# Patient Record
Sex: Female | Born: 1985 | Race: White | Hispanic: No | Marital: Married | Smoking: Never smoker
Health system: Southern US, Community
[De-identification: ages and names within clinical notes are randomized; demographics above are authoritative.]

## PROBLEM LIST (undated history)

## (undated) ENCOUNTER — Inpatient Hospital Stay (HOSPITAL_COMMUNITY): Payer: Self-pay

## (undated) DIAGNOSIS — I10 Essential (primary) hypertension: Secondary | ICD-10-CM

## (undated) DIAGNOSIS — R51 Headache: Secondary | ICD-10-CM

## (undated) HISTORY — PX: WISDOM TOOTH EXTRACTION: SHX21

## (undated) HISTORY — PX: MENISCUS REPAIR: SHX5179

---
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
## 2011-07-02 ENCOUNTER — Ambulatory Visit
Admission: RE | Admit: 2011-07-02 | Discharge: 2011-07-02 | Disposition: A | Discharge status: Home / Self Care | Payer: PRIVATE HEALTH INSURANCE | Source: Ambulatory Visit | Attending: Occupational Medicine | Admitting: Occupational Medicine

## 2011-07-02 ENCOUNTER — Other Ambulatory Visit: Payer: Self-pay | Admitting: Occupational Medicine

## 2011-07-02 DIAGNOSIS — Z021 Encounter for pre-employment examination: Secondary | ICD-10-CM

## 2013-05-23 LAB — OB RESULTS CONSOLE ANTIBODY SCREEN: ANTIBODY SCREEN: NEGATIVE

## 2013-05-23 LAB — OB RESULTS CONSOLE GC/CHLAMYDIA
CHLAMYDIA, DNA PROBE: NEGATIVE
Gonorrhea: NEGATIVE

## 2013-05-23 LAB — OB RESULTS CONSOLE HGB/HCT, BLOOD
HCT: 37 %
Hemoglobin: 12.8 g/dL

## 2013-05-23 LAB — OB RESULTS CONSOLE RUBELLA ANTIBODY, IGM: Rubella: IMMUNE

## 2013-05-23 LAB — OB RESULTS CONSOLE HEPATITIS B SURFACE ANTIGEN: Hepatitis B Surface Ag: NEGATIVE

## 2013-05-23 LAB — OB RESULTS CONSOLE ABO/RH: RH Type: POSITIVE

## 2013-05-23 LAB — OB RESULTS CONSOLE HIV ANTIBODY (ROUTINE TESTING): HIV: NONREACTIVE

## 2013-05-23 LAB — OB RESULTS CONSOLE RPR: RPR: NONREACTIVE

## 2013-05-23 LAB — OB RESULTS CONSOLE PLATELET COUNT: Platelets: 233 10*3/uL

## 2013-07-27 NOTE — L&D Delivery Note (Signed)
Vtx at +3 station and LOA.  Pt desires VE.  I discussed the R&B of VE including but not limited to injury to fetus but the potential benefit of expedited delivery.  She gives her informed consent and wishes to proceed. On the 3rd pull delivered viable female apgars 9,9 over small vaginal laceration. Delivery Note  Placenta delivered spontaneously intact with 3VC. Repair with 2-0 Chromic with good support and hemostasis noted and R/V exam confirms.  PH art was sent.  [place] cord blood was not done.  Mother and baby were doing well.  EBL 300cc  Candice Camp, MD

## 2013-10-25 ENCOUNTER — Encounter (HOSPITAL_COMMUNITY): Payer: Self-pay | Admitting: *Deleted

## 2013-10-25 ENCOUNTER — Inpatient Hospital Stay (HOSPITAL_COMMUNITY): Payer: 59

## 2013-10-25 ENCOUNTER — Inpatient Hospital Stay (HOSPITAL_COMMUNITY)
Admission: AD | Admit: 2013-10-25 | Discharge: 2013-10-25 | Disposition: A | Discharge status: Home / Self Care | Payer: 59 | Source: Ambulatory Visit | Attending: Obstetrics and Gynecology | Admitting: Obstetrics and Gynecology

## 2013-10-25 DIAGNOSIS — O133 Gestational [pregnancy-induced] hypertension without significant proteinuria, third trimester: Secondary | ICD-10-CM

## 2013-10-25 DIAGNOSIS — O169 Unspecified maternal hypertension, unspecified trimester: Secondary | ICD-10-CM

## 2013-10-25 DIAGNOSIS — O10019 Pre-existing essential hypertension complicating pregnancy, unspecified trimester: Secondary | ICD-10-CM | POA: Insufficient documentation

## 2013-10-25 HISTORY — DX: Essential (primary) hypertension: I10

## 2013-10-25 LAB — COMPREHENSIVE METABOLIC PANEL
ALK PHOS: 61 U/L (ref 39–117)
ALT: 13 U/L (ref 0–35)
AST: 14 U/L (ref 0–37)
Albumin: 2.7 g/dL — ABNORMAL LOW (ref 3.5–5.2)
BUN: 7 mg/dL (ref 6–23)
CHLORIDE: 102 meq/L (ref 96–112)
CO2: 23 meq/L (ref 19–32)
Calcium: 8.6 mg/dL (ref 8.4–10.5)
Creatinine, Ser: 0.5 mg/dL (ref 0.50–1.10)
GFR calc Af Amer: >90 mL/min (ref >90)
Glucose, Bld: 107 mg/dL — ABNORMAL HIGH (ref 70–99)
Potassium: 3.7 mEq/L (ref 3.7–5.3)
SODIUM: 138 meq/L (ref 137–147)
Total Protein: 6.4 g/dL (ref 6.0–8.3)

## 2013-10-25 LAB — URINALYSIS, ROUTINE W REFLEX MICROSCOPIC
Bilirubin Urine: NEGATIVE
GLUCOSE, UA: 100 mg/dL — AB
HGB URINE DIPSTICK: NEGATIVE
Ketones, ur: NEGATIVE mg/dL
Leukocytes, UA: NEGATIVE
Nitrite: NEGATIVE
PH: 6 (ref 5.0–8.0)
Protein, ur: NEGATIVE mg/dL
Specific Gravity, Urine: >1.03 — ABNORMAL HIGH (ref 1.005–1.030)
UROBILINOGEN UA: 0.2 mg/dL (ref 0.0–1.0)

## 2013-10-25 LAB — CBC
HCT: 35.2 % — ABNORMAL LOW (ref 36.0–46.0)
Hemoglobin: 12.2 g/dL (ref 12.0–15.0)
MCH: 30.7 pg (ref 26.0–34.0)
MCHC: 34.7 g/dL (ref 30.0–36.0)
MCV: 88.4 fL (ref 78.0–100.0)
Platelets: 187 10*3/uL (ref 150–400)
RBC: 3.98 MIL/uL (ref 3.87–5.11)
RDW: 13.3 % (ref 11.5–15.5)
WBC: 12.7 10*3/uL — ABNORMAL HIGH (ref 4.0–10.5)

## 2013-10-25 LAB — PROTEIN / CREATININE RATIO, URINE
Creatinine, Urine: 184.44 mg/dL
Protein Creatinine Ratio: 0.07 (ref 0.00–0.15)
Total Protein, Urine: 12.3 mg/dL

## 2013-10-25 LAB — URIC ACID: Uric Acid, Serum: 3.7 mg/dL (ref 2.4–7.0)

## 2013-10-25 NOTE — MAU Note (Signed)
Patient states she was seen in the office today and had protein in her urine. Has had HTN for 2 years. Sent to MAU for further evaluation.

## 2013-10-25 NOTE — Discharge Instructions (Signed)
Hypertension During Pregnancy Hypertension is also called high blood pressure. It can occur at any time in life and during pregnancy. When you have hypertension, there is extra pressure inside your blood vessels that carry blood from the heart to the rest of your body (arteries). Hypertension during pregnancy can cause problems for you and your baby. Your baby might not weigh as much as it should at birth or might be born early (premature). Very bad cases of hypertension during pregnancy can be life threatening.  Different types of hypertension can occur during pregnancy.   Chronic hypertension. This happens when a woman has hypertension before pregnancy and it continues during pregnancy.  Gestational hypertension. This is when hypertension develops during pregnancy.  Preeclampsia or toxemia of pregnancy. This is a very serious type of hypertension that develops only during pregnancy. It is a disease that affects the whole body (systemic) and can be very dangerous for both mother and baby.  Gestational hypertension and preeclampsia usually go away after your baby is born. Blood pressure generally stabilizes within 6 weeks. Women who have hypertension during pregnancy have a greater chance of developing hypertension later in life or with future pregnancies. RISK FACTORS Some factors make you more likely to develop hypertension during pregnancy. Risk factors include:  Having hypertension before pregnancy.  Having hypertension during a previous pregnancy.  Being overweight.  Being older than 40.  Being pregnant with more than one baby (multiples).  Having diabetes or kidney problems. SIGNS AND SYMPTOMS Chronic and gestational hypertension rarely cause symptoms. Preeclampsia has symptoms, which may include:  Increased protein in your urine. Your health care provider will check for this at every prenatal visit.  Swelling of your hands and face.  Rapid weight gain.  Headaches.  Visual  changes.  Being bothered by light.  Abdominal pain, especially in the right upper area.  Chest pain.  Shortness of breath.  Increased reflexes.  Seizures. Seizures occur with a more severe form of preeclampsia, called eclampsia. DIAGNOSIS   You may be diagnosed with hypertension during a regular prenatal exam. At each visit, tests may include:  Blood pressure checks.  A urine test to check for protein in your urine.  The type of hypertension you are diagnosed with depends on when you developed it. It also depends on your specific blood pressure reading.  Developing hypertension before 20 weeks of pregnancy is consistent with chronic hypertension.  Developing hypertension after 20 weeks of pregnancy is consistent with gestational hypertension.  Hypertension with increased urinary protein is diagnosed as preeclampsia.  Blood pressure measurements that stay above 160 systolic or 110 diastolic are a sign of severe preeclampsia. TREATMENT Treatment for hypertension during pregnancy varies. Treatment depends on the type of hypertension and how serious it is.  If you take medicine for chronic hypertension, you may need to switch medicines.  Drugs called ACE inhibitors should not be taken during pregnancy.  Low-dose aspirin may be suggested for women who have risk factors for preeclampsia.  If you have gestational hypertension, you may need to take a blood pressure medicine that is safe during pregnancy. Your health care provider will recommend the appropriate medicine.  If you have severe preeclampsia, you may need to be in the hospital. Health care providers will watch you and the baby very closely. You also may need to take medicine (magnesium sulfate) to prevent seizures and lower blood pressure.  Sometimes an early delivery is needed. This may be the case if the condition worsens. It would   be done to protect you and the baby. The only cure for preeclampsia is delivery. HOME  CARE INSTRUCTIONS  Schedule and keep all of your regular appointments for prenatal care.  Only take over-the-counter or prescription medicines as directed by your health care provider. Tell your health care provider about all medicines you take.  Eat as little salt as possible.  Get regular exercise.  Do not drink alcohol.  Do not use tobacco products.  Do not drink products with caffeine.  Lie on your left side when resting. SEEK IMMEDIATE MEDICAL CARE IF:  You have severe abdominal pain.  You have sudden swelling in the hands, ankles, or face.  You gain 4 pounds (1.8 kg) or more in 1 week.  You vomit repeatedly.  You have vaginal bleeding.  You do not feel the baby moving as much.  You have a headache.  You have blurred or double vision.  You have muscle twitching or spasms.  You have shortness of breath.  You have blue fingernails and lips.  You have blood in your urine. MAKE SURE YOU:  Understand these instructions.  Will watch your condition.  Will get help right away if you are not doing well or get worse. Document Released: 03/31/2011 Document Revised: 05/03/2013 Document Reviewed: 02/09/2013 [employer] Patient Information 2014 [employer].  

## 2013-10-25 NOTE — MAU Provider Note (Signed)
History     CSN: [id number]  Arrival date and time: 10/25/13 1545   None     Chief Complaint  Patient presents with  . Proteinuria   HPI This is a 28 y.o. female at 60w0dwho [facility]  She had proteinuria in office today. Has chronic hypertension. Denies headache, abdominal pain or visual changes  RN Note:  Patient states she was seen in the office today and had protein in her urine. Has had HTN for 2 years. Sent to MAU for further evaluation.       OB History   Grav Para Term Preterm Abortions TAB SAB Ect Mult Living   1         0      Past Medical History  Diagnosis Date  . Hypertension     Past Surgical History  Procedure Laterality Date  . Meniscus repair      X 2 L knee    No family history on file.  History  Substance Use Topics  . Smoking status: Never Smoker   . Smokeless tobacco: Never Used  . Alcohol Use: Yes     Comment: occas. prior to preg.    Allergies:  Allergies  Allergen Reactions  . Penicillins Hives  . Sulfa Antibiotics Hives    No prescriptions prior to admission    Review of Systems  Constitutional: Negative for fever, chills and malaise/fatigue.  Eyes: Negative for blurred vision and double vision.  Cardiovascular: Negative for leg swelling.  Gastrointestinal: Negative for abdominal pain.  Neurological: Negative for headaches.   Physical Exam   Blood pressure 132/86, pulse 87, height _0  (1.727 m), weight 91.445 kg (201 lb 9.6 oz).  Physical Exam  Constitutional: She is oriented to person, place, and time. She appears well-developed and well-nourished. No distress.  HENT:  Head: Normocephalic.  Cardiovascular: Normal rate.   Respiratory: Effort normal.  GI: Soft. There is no tenderness.  Musculoskeletal: Normal range of motion. She exhibits no edema.  Neurological: She is alert and oriented to person, place, and time.  Skin: Skin is warm and dry.  Psychiatric: She has a normal mood  and affect.    MAU Course  Procedures  MDM Results for orders placed during the hospital encounter of 10/25/13 (from the past 72 hour(s))  CBC     Status: Abnormal   Collection Time    10/25/13  3:57 PM      Result Value Ref Range   WBC 12.7 (*) 4.0 - 10.5 K/uL   RBC 3.98  3.87 - 5.11 MIL/uL   Hemoglobin 12.2  12.0 - 15.0 g/dL   HCT 35.2 (*) 36.0 - 46.0 %   MCV 88.4  78.0 - 100.0 fL   MCH 30.7  26.0 - 34.0 pg   MCHC 34.7  30.0 - 36.0 g/dL   RDW 13.3  11.5 - 15.5 %   Platelets 187  150 - 400 K/uL  COMPREHENSIVE METABOLIC PANEL     Status: Abnormal   Collection Time    10/25/13  3:57 PM      Result Value Ref Range   Sodium 138  137 - 147 mEq/L   Potassium 3.7  3.7 - 5.3 mEq/L   Chloride 102  96 - 112 mEq/L   CO2 23  19 - 32 mEq/L   Glucose, Bld 107 (*) 70 - 99 mg/dL   BUN 7  6 - 23 mg/dL   Creatinine, Ser 0.50  0.50 - 1.10 mg/dL   Calcium 8.6  8.4 - 10.5 mg/dL   Total Protein 6.4  6.0 - 8.3 g/dL   Albumin 2.7 (*) 3.5 - 5.2 g/dL   AST 14  0 - 37 U/L   ALT 13  0 - 35 U/L   Alkaline Phosphatase 61  39 - 117 U/L   Total Bilirubin <0.2 (*) 0.3 - 1.2 mg/dL   Comment: REPEATED TO VERIFY   GFR calc non Af Amer >90  >90 mL/min   GFR calc Af Amer >90  >90 mL/min   Comment: (NOTE)     The eGFR has been calculated using the CKD EPI equation.     This calculation has not been validated in all clinical situations.     eGFR's persistently <90 mL/min signify possible Chronic Kidney     Disease.  URIC ACID     Status: None   Collection Time    10/25/13  3:57 PM      Result Value Ref Range   Uric Acid, Serum 3.7  2.4 - 7.0 mg/dL  PROTEIN / CREATININE RATIO, URINE     Status: None   Collection Time    10/25/13  4:00 PM      Result Value Ref Range   Creatinine, Urine 184.44     Total Protein, Urine 12.3     Comment: NO NORMAL RANGE ESTABLISHED FOR THIS TEST   PROTEIN CREATININE RATIO 0.07  0.00 - 0.15  URINALYSIS, ROUTINE W REFLEX MICROSCOPIC     Status: Abnormal   Collection  Time    10/25/13  4:00 PM      Result Value Ref Range   Color, Urine YELLOW  YELLOW   APPearance HAZY (*) CLEAR   Specific Gravity, Urine >1.030 (*) 1.005 - 1.030   pH 6.0  5.0 - 8.0   Glucose, UA 100 (*) NEGATIVE mg/dL   Hgb urine dipstick NEGATIVE  NEGATIVE   Bilirubin Urine NEGATIVE  NEGATIVE   Ketones, ur NEGATIVE  NEGATIVE mg/dL   Protein, ur NEGATIVE  NEGATIVE mg/dL   Urobilinogen, UA 0.2  0.0 - 1.0 mg/dL   Nitrite NEGATIVE  NEGATIVE   Leukocytes, UA NEGATIVE  NEGATIVE   Comment: MICROSCOPIC NOT DONE ON URINES WITH NEGATIVE PROTEIN, BLOOD, LEUKOCYTES, NITRITE, OR GLUCOSE <1000 mg/dL.   [place]:  BPP 8/8, normal growth, normal AFI, normal anatomy  Assessment and Plan  A:  SIUP at [redacted]w[redacted]d       Chronic hypertension       Normal PIH eval       Reassuring fetal status  P:   Discussed with Dr TGaetano Net      Discharge home        Preeclampsia precautions        Follow up in office  [facility], 4:13 PM

## 2013-12-13 ENCOUNTER — Inpatient Hospital Stay (HOSPITAL_COMMUNITY)
Admission: AD | Admit: 2013-12-13 | Discharge: 2013-12-13 | Disposition: A | Discharge status: Home / Self Care | Payer: 59 | Source: Ambulatory Visit | Attending: Obstetrics and Gynecology | Admitting: Obstetrics and Gynecology

## 2013-12-13 ENCOUNTER — Encounter (HOSPITAL_COMMUNITY): Payer: Self-pay | Admitting: *Deleted

## 2013-12-13 ENCOUNTER — Inpatient Hospital Stay (HOSPITAL_COMMUNITY): Payer: 59

## 2013-12-13 DIAGNOSIS — N949 Unspecified condition associated with female genital organs and menstrual cycle: Secondary | ICD-10-CM | POA: Insufficient documentation

## 2013-12-13 DIAGNOSIS — O4700 False labor before 37 completed weeks of gestation, unspecified trimester: Secondary | ICD-10-CM | POA: Insufficient documentation

## 2013-12-13 DIAGNOSIS — O163 Unspecified maternal hypertension, third trimester: Secondary | ICD-10-CM

## 2013-12-13 DIAGNOSIS — O10019 Pre-existing essential hypertension complicating pregnancy, unspecified trimester: Secondary | ICD-10-CM | POA: Insufficient documentation

## 2013-12-13 LAB — URINALYSIS, ROUTINE W REFLEX MICROSCOPIC
Bilirubin Urine: NEGATIVE
Glucose, UA: NEGATIVE mg/dL
Hgb urine dipstick: NEGATIVE
Ketones, ur: NEGATIVE mg/dL
NITRITE: NEGATIVE
PH: 6.5 (ref 5.0–8.0)
Protein, ur: NEGATIVE mg/dL
Specific Gravity, Urine: 1.025 (ref 1.005–1.030)
Urobilinogen, UA: 0.2 mg/dL (ref 0.0–1.0)

## 2013-12-13 LAB — URINE MICROSCOPIC-ADD ON

## 2013-12-13 NOTE — MAU Note (Signed)
Pt ws seen in office today. Pt reports abdominal cramping and pressure. Pt reports that her doctor checked her cervix and "everything was ok". Pt reports that her doctor wants her to monitored and have IV fluids.

## 2013-12-13 NOTE — MAU Note (Signed)
Sent from OB's office to MAU; c/o some cramping and vaginal pressure; cervix closed per SVE in OB's office today;

## 2013-12-13 NOTE — Discharge Instructions (Signed)
Fetal Movement Counts Patient Name: __________________________________________________ Patient Due Date: ____________________ Performing a fetal movement count is highly recommended in high-risk pregnancies, but it is good for every pregnant woman to do. Your caregiver may ask you to start counting fetal movements at 28 weeks of the pregnancy. Fetal movements often increase:  After eating a full meal.  After physical activity.  After eating or drinking something sweet or cold.  At rest. Pay attention to when you feel the baby is most active. This will help you notice a pattern of your baby's sleep and wake cycles and what factors contribute to an increase in fetal movement. It is important to perforAbdominal Pain During Pregnancy Abdominal pain is common in pregnancy. Most of the time, it does not cause harm. There are many causes of abdominal pain. Some causes are more serious than others. Some of the causes of abdominal pain in pregnancy are easily diagnosed. Occasionally, the diagnosis takes time to understand. Other times, the cause is not determined. Abdominal pain can be a sign that something is very wrong with the pregnancy, or the pain may have nothing to do with the pregnancy at all. For this reason, always tell your health care provider if you have any abdominal discomfort. HOME CARE INSTRUCTIONS  Monitor your abdominal pain for any changes. The following actions may help to alleviate any discomfort you are experiencing:  Do not have sexual intercourse or put anything in your vagina until your symptoms go away completely.  Get plenty of rest until your pain improves.  Drink clear fluids if you feel nauseous. Avoid solid food as long as you are uncomfortable or nauseous.  Only take over-the-counter or prescription medicine as directed by your health care provider.  Keep all follow-up appointments with your health care provider. SEEK IMMEDIATE MEDICAL CARE IF:  You are bleeding,  leaking fluid, or passing tissue from the vagina.  You have increasing pain or cramping.  You have persistent vomiting.  You have painful or bloody urination.  You have a fever.  You notice a decrease in your baby's movements.  You have extreme weakness or feel faint.  You have shortness of breath, with or without abdominal pain.  You develop a severe headache with abdominal pain.  You have abnormal vaginal discharge with abdominal pain.  You have persistent diarrhea.  You have abdominal pain that continues even after rest, or gets worse. MAKE SURE YOU:   Understand these instructions.  Will watch your condition.  Will get help right away if you are not doing well or get worse. Document Released: 07/13/2005 Document Revised: 05/03/2013 Document Reviewed: 02/09/2013 [facility] Patient Information 2014 [place], [place]. me time each day when your baby is normally most active.  HOW TO COUNT FETAL MOVEMENTS 1. Find a quiet and comfortable area to sit or lie down on your left side. Lying on your left side provides the best blood and oxygen circulation to your baby. 2. Write down the day and time on a sheet of paper or in a journal. 3. Start counting kicks, flutters, swishes, rolls, or jabs in a 2 hour period. You should feel at least 10 movements within 2 hours. 4. If you do not feel 10 movements in 2 hours, wait 2 3 hours and count again. Look for a change in the pattern or not enough counts in 2 hours. SEEK MEDICAL CARE IF:  You feel less than 10 counts in 2 hours, tried twice.  There is no movement in over an hour.  The pattern is changing or taking longer each day to reach 10 counts in 2 hours.  You feel the baby is not moving as he or she usually does. Date: ____________ Movements: ____________ Start time: ____________ Doreatha MartinFinish time: ____________  Date: ____________ Movements: ____________ Start time: ____________ Doreatha MartinFinish time: ____________ Date: ____________ Movements:  ____________ Start time: ____________ Doreatha MartinFinish time: ____________ Date: ____________ Movements: ____________ Start time: ____________ Doreatha MartinFinish time: ____________ Date: ____________ Movements: ____________ Start time: ____________ Doreatha MartinFinish time: ____________ Date: ____________ Movements: ____________ Start time: ____________ Doreatha MartinFinish time: ____________ Date: ____________ Movements: ____________ Start time: ____________ Doreatha MartinFinish time: ____________ Date: ____________ Movements: ____________ Start time: ____________ Doreatha MartinFinish time: ____________  Date: ____________ Movements: ____________ Start time: ____________ Doreatha MartinFinish time: ____________ Date: ____________ Movements: ____________ Start time: ____________ Doreatha MartinFinish time: ____________ Date: ____________ Movements: ____________ Start time: ____________ Doreatha MartinFinish time: ____________ Date: ____________ Movements: ____________ Start time: ____________ Doreatha MartinFinish time: ____________ Date: ____________ Movements: ____________ Start time: ____________ Doreatha MartinFinish time: ____________ Date: ____________ Movements: ____________ Start time: ____________ Doreatha MartinFinish time: ____________ Date: ____________ Movements: ____________ Start time: ____________ Doreatha MartinFinish time: ____________  Date: ____________ Movements: ____________ Start time: ____________ Doreatha MartinFinish time: ____________ Date: ____________ Movements: ____________ Start time: ____________ Doreatha MartinFinish time: ____________ Date: ____________ Movements: ____________ Start time: ____________ Doreatha MartinFinish time: ____________ Date: ____________ Movements: ____________ Start time: ____________ Doreatha MartinFinish time: ____________ Date: ____________ Movements: ____________ Start time: ____________ Doreatha MartinFinish time: ____________ Date: ____________ Movements: ____________ Start time: ____________ Doreatha MartinFinish time: ____________ Date: ____________ Movements: ____________ Start time: ____________ Doreatha MartinFinish time: ____________  Date: ____________ Movements: ____________ Start time: ____________ Doreatha MartinFinish  time: ____________ Date: ____________ Movements: ____________ Start time: ____________ Doreatha MartinFinish time: ____________ Date: ____________ Movements: ____________ Start time: ____________ Doreatha MartinFinish time: ____________ Date: ____________ Movements: ____________ Start time: ____________ Doreatha MartinFinish time: ____________ Date: ____________ Movements: ____________ Start time: ____________ Doreatha MartinFinish time: ____________ Date: ____________ Movements: ____________ Start time: ____________ Doreatha MartinFinish time: ____________ Date: ____________ Movements: ____________ Start time: ____________ Doreatha MartinFinish time: ____________  Date: ____________ Movements: ____________ Start time: ____________ Doreatha MartinFinish time: ____________ Date: ____________ Movements: ____________ Start time: ____________ Doreatha MartinFinish time: ____________ Date: ____________ Movements: ____________ Start time: ____________ Doreatha MartinFinish time: ____________ Date: ____________ Movements: ____________ Start time: ____________ Doreatha MartinFinish time: ____________ Date: ____________ Movements: ____________ Start time: ____________ Doreatha MartinFinish time: ____________ Date: ____________ Movements: ____________ Start time: ____________ Doreatha MartinFinish time: ____________ Date: ____________ Movements: ____________ Start time: ____________ Doreatha MartinFinish time: ____________  Date: ____________ Movements: ____________ Start time: ____________ Doreatha MartinFinish time: ____________ Date: ____________ Movements: ____________ Start time: ____________ Doreatha MartinFinish time: ____________ Date: ____________ Movements: ____________ Start time: ____________ Doreatha MartinFinish time: ____________ Date: ____________ Movements: ____________ Start time: ____________ Doreatha MartinFinish time: ____________ Date: ____________ Movements: ____________ Start time: ____________ Doreatha MartinFinish time: ____________ Date: ____________ Movements: ____________ Start time: ____________ Doreatha MartinFinish time: ____________ Date: ____________ Movements: ____________ Start time: ____________ Doreatha MartinFinish time: ____________  Date: ____________  Movements: ____________ Start time: ____________ Doreatha MartinFinish time: ____________ Date: ____________ Movements: ____________ Start time: ____________ Doreatha MartinFinish time: ____________ Date: ____________ Movements: ____________ Start time: ____________ Doreatha MartinFinish time: ____________ Date: ____________ Movements: ____________ Start time: ____________ Doreatha MartinFinish time: ____________ Date: ____________ Movements: ____________ Start time: ____________ Doreatha MartinFinish time: ____________ Date: ____________ Movements: ____________ Start time: ____________ Doreatha MartinFinish time: ____________ Date: ____________ Movements: ____________ Start time: ____________ Doreatha MartinFinish time: ____________  Date: ____________ Movements: ____________ Start time: ____________ Doreatha MartinFinish time: ____________ Date: ____________ Movements: ____________ Start time: ____________ Doreatha MartinFinish time: ____________ Date: ____________ Movements: ____________ Start time: ____________ Doreatha MartinFinish time: ____________ Date: ____________ Movements: ____________ Start time: ____________ Doreatha MartinFinish time: ____________ Date: ____________ Movements: ____________ Start time: ____________ Doreatha MartinFinish time: ____________ Date: ____________ Movements: ____________ Start time: ____________ Doreatha MartinFinish time:  ____________ Document Released: 08/12/2006 Document Revised: 06/29/2012 Document Reviewed: 05/09/2012 [facility] Patient Information 2014 [place], [place]. AndAnd no

## 2013-12-13 NOTE — MAU Provider Note (Signed)
Chief Complaint:  Abdominal Pain   First Provider Initiated Contact with Patient 12/13/13 1425      HPI: Leslie HarveyStacey L Whetzel is a 28 y.o. G1P0 at 7842w3d who presents to maternity admissions who was sent from [facility] for monitoring due to 1 d hx of pelvic and rectal pain and pressure.  Denies contractions, leakage of fluid or vaginal bleeding. Good fetal movement. No H/A, visual sx, epigastric pain.  Pregnancy Course:  CHTN on Norvasc 5mg  q hs.  Good growth at last scan about 32 wk per pt  Past Medical History: Past Medical History  Diagnosis Date  . Hypertension   . Vaginal Pap smear, abnormal   . Pyelonephritis     Past obstetric history: OB History  Gravida Para Term Preterm AB SAB TAB Ectopic Multiple Living  1         0    # Outcome Date GA Lbr Len/2nd Weight Sex Delivery Anes PTL Lv  1 CUR               Past Surgical History: Past Surgical History  Procedure Laterality Date  . Meniscus repair      X 2 L knee     Family History: Family History  Problem Relation Age of Onset  . Hypertension Mother   . Diabetes Mother   . Thyroid disease Mother   . Hypertension Father   . Diabetes Maternal Aunt   . Diabetes Paternal Grandmother   . Stroke Paternal Grandmother   . Stroke Paternal Grandfather     Social History: History  Substance Use Topics  . Smoking status: Never Smoker   . Smokeless tobacco: Never Used  . Alcohol Use: Yes     Comment: occas. prior to preg.    Allergies:  Allergies  Allergen Reactions  . Amoxicillin Hives  . Penicillins Hives  . Sulfa Antibiotics Hives    Meds:  Prescriptions prior to admission  Medication Sig Dispense Refill  . acetaminophen (TYLENOL) 325 MG tablet Take 650 mg by mouth every 6 (six) hours as needed for headache.      Marland Kitchen. amLODipine (NORVASC) 5 MG tablet Take 5 mg by mouth at bedtime.       . calcium carbonate (TUMS - DOSED IN MG ELEMENTAL CALCIUM) 500 MG chewable tablet Chew 1 tablet by mouth as needed for indigestion  or heartburn.      . Prenatal Vit-Fe Fumarate-FA (PRENATAL MULTIVITAMIN) TABS tablet Take 1 tablet by mouth daily at 12 noon.        ROS: Pertinent findings in history of present illness.  Physical Exam  Blood pressure 137/89, pulse 90, temperature 98.4 F (36.9 C), temperature source Oral, resp. rate 18, height 5\' 7"  (1.702 m), weight 97.523 kg (215 lb). GENERAL: Well-developed, well-nourished female in no acute distress.  HEENT: normocephalic HEART: normal rate RESP: normal effort ABDOMEN: Soft, non-tender, gravid appropriate for gestational age EXTREMITIES: Nontender, no edema NEURO: alert and oriented Cx posterio andr, L/C/-2    FHT:  Baseline 130-57135moderate variability, accelerations present, no decelerations Contractions: occasional mild, UI   Labs: Results for orders placed during the hospital encounter of 12/13/13 (from the past 24 hour(s))  URINALYSIS, ROUTINE W REFLEX MICROSCOPIC     Status: Abnormal   Collection Time    12/13/13 12:36 PM      Result Value Ref Range   Color, Urine YELLOW  YELLOW   APPearance HAZY (*) CLEAR   Specific Gravity, Urine 1.025  1.005 - 1.030  pH 6.5  5.0 - 8.0   Glucose, UA NEGATIVE  NEGATIVE mg/dL   Hgb urine dipstick NEGATIVE  NEGATIVE   Bilirubin Urine NEGATIVE  NEGATIVE   Ketones, ur NEGATIVE  NEGATIVE mg/dL   Protein, ur NEGATIVE  NEGATIVE mg/dL   Urobilinogen, UA 0.2  0.0 - 1.0 mg/dL   Nitrite NEGATIVE  NEGATIVE   Leukocytes, UA SMALL (*) NEGATIVE  URINE MICROSCOPIC-ADD ON     Status: Abnormal   Collection Time    12/13/13 12:36 PM      Result Value Ref Range   Squamous Epithelial / LPF MANY (*) RARE   WBC, UA 0-2  <3 WBC/hpf   Bacteria, UA FEW (*) RARE   Crystals CA OXALATE CRYSTALS (*) NEGATIVE    Imaging:  AFI 14.35   MAU Course:   Assessment: 1. Round ligament pain   2. Hypertension complicating pregnancy in third trimester   S<D, reactive NST and normal AFI and and  Plan: Discharge home Labor precautions  and fetal kick counts    Medication List         acetaminophen 325 MG tablet  Commonly known as:  TYLENOL  Take 650 mg by mouth every 6 (six) hours as needed for headache.     amLODipine 5 MG tablet  Commonly known as:  NORVASC  Take 5 mg by mouth at bedtime.     calcium carbonate 500 MG chewable tablet  Commonly known as:  TUMS - dosed in mg elemental calcium  Chew 1 tablet by mouth as needed for indigestion or heartburn.     prenatal multivitamin Tabs tablet  Take 1 tablet by mouth daily at 12 noon.       Follow-up Information   Follow up with TOMBLIN II,JAMES E, MD In 1 week. (Keep your scheduled prenatal appointment)    Specialty:  Obstetrics and Gynecology   Contact information:   [address] [place] [address] [phone]       Danae Orleanseirdre C Avien Taha, CNM 12/13/2013 2:41 PM

## 2013-12-13 NOTE — MAU Note (Signed)
Having a lot of abd pain and lower pelvic/vag pressure. Started yesterday.  Was checked in office. cx was closed. Sent over for monitoring and fluids.

## 2013-12-20 ENCOUNTER — Inpatient Hospital Stay (HOSPITAL_COMMUNITY)
Admission: AD | Admit: 2013-12-20 | Discharge: 2013-12-20 | Disposition: A | Discharge status: Home / Self Care | Payer: 59 | Source: Ambulatory Visit | Attending: Obstetrics and Gynecology | Admitting: Obstetrics and Gynecology

## 2013-12-20 ENCOUNTER — Encounter (HOSPITAL_COMMUNITY): Payer: Self-pay | Admitting: *Deleted

## 2013-12-20 DIAGNOSIS — O139 Gestational [pregnancy-induced] hypertension without significant proteinuria, unspecified trimester: Secondary | ICD-10-CM

## 2013-12-20 DIAGNOSIS — R51 Headache: Secondary | ICD-10-CM | POA: Insufficient documentation

## 2013-12-20 DIAGNOSIS — O10019 Pre-existing essential hypertension complicating pregnancy, unspecified trimester: Secondary | ICD-10-CM | POA: Insufficient documentation

## 2013-12-20 DIAGNOSIS — O133 Gestational [pregnancy-induced] hypertension without significant proteinuria, third trimester: Secondary | ICD-10-CM

## 2013-12-20 HISTORY — DX: Headache: R51

## 2013-12-20 LAB — URIC ACID: Uric Acid, Serum: 4.5 mg/dL (ref 2.4–7.0)

## 2013-12-20 LAB — URINALYSIS, ROUTINE W REFLEX MICROSCOPIC
BILIRUBIN URINE: NEGATIVE
GLUCOSE, UA: NEGATIVE mg/dL
HGB URINE DIPSTICK: NEGATIVE
Ketones, ur: NEGATIVE mg/dL
Nitrite: NEGATIVE
Protein, ur: NEGATIVE mg/dL
SPECIFIC GRAVITY, URINE: 1.025 (ref 1.005–1.030)
UROBILINOGEN UA: 0.2 mg/dL (ref 0.0–1.0)
pH: 6.5 (ref 5.0–8.0)

## 2013-12-20 LAB — URINE MICROSCOPIC-ADD ON

## 2013-12-20 LAB — COMPREHENSIVE METABOLIC PANEL
ALT: 14 U/L (ref 0–35)
AST: 17 U/L (ref 0–37)
Albumin: 2.7 g/dL — ABNORMAL LOW (ref 3.5–5.2)
Alkaline Phosphatase: 95 U/L (ref 39–117)
BILIRUBIN TOTAL: 0.2 mg/dL — AB (ref 0.3–1.2)
BUN: 6 mg/dL (ref 6–23)
CALCIUM: 8.7 mg/dL (ref 8.4–10.5)
CO2: 20 mEq/L (ref 19–32)
Chloride: 102 mEq/L (ref 96–112)
Creatinine, Ser: 0.52 mg/dL (ref 0.50–1.10)
GLUCOSE: 63 mg/dL — AB (ref 70–99)
Potassium: 3.8 mEq/L (ref 3.7–5.3)
Sodium: 137 mEq/L (ref 137–147)
Total Protein: 6.1 g/dL (ref 6.0–8.3)

## 2013-12-20 LAB — CBC
HEMATOCRIT: 36.7 % (ref 36.0–46.0)
Hemoglobin: 12.8 g/dL (ref 12.0–15.0)
MCH: 30.8 pg (ref 26.0–34.0)
MCHC: 34.9 g/dL (ref 30.0–36.0)
MCV: 88.2 fL (ref 78.0–100.0)
Platelets: 146 10*3/uL — ABNORMAL LOW (ref 150–400)
RBC: 4.16 MIL/uL (ref 3.87–5.11)
RDW: 13.8 % (ref 11.5–15.5)
WBC: 9.3 10*3/uL (ref 4.0–10.5)

## 2013-12-20 MED ORDER — TRAMADOL HCL 50 MG PO TABS
50.0000 mg | ORAL_TABLET | Freq: Once | ORAL | Status: AC
  Administered 2013-12-20: 50 mg via ORAL
  Filled 2013-12-20: qty 1

## 2013-12-20 MED ORDER — ACETAMINOPHEN 325 MG PO TABS
650.0000 mg | ORAL_TABLET | Freq: Once | ORAL | Status: AC
  Administered 2013-12-20: 650 mg via ORAL
  Filled 2013-12-20: qty 2

## 2013-12-20 MED ORDER — BUTALBITAL-APAP-CAFFEINE 50-325-40 MG PO TABS: 2.0000 | ORAL_TABLET | Freq: Once | ORAL | Status: DC

## 2013-12-20 NOTE — MAU Note (Signed)
Pt has hx of chronic HTN, on norvasc, has had HA x 3 days, has been monitoring BP @ home, which is elevated - 140/99, 150/100.    Was advised by MD office to come to MAU this a.m.

## 2013-12-20 NOTE — MAU Provider Note (Signed)
History     CSN: [id number]  Arrival date and time: 12/20/13 3474   First Provider Initiated Contact with Patient 12/20/13 [phone]      Chief Complaint  Patient presents with  . Headache  . Hypertension   HPI This is a 28 y.o. female at 47w3dwho presents with c/o headache in the setting of hypertension. States BP elevated at home today. Was told to come in for evaluation.  RN Note:  Pt has hx of chronic HTN, on norvasc, has had HA x 3 days, has been monitoring BP @ home, which is elevated - 140/99, 150/100. Was advised by MD office to come to MAU this a.m.       OB History   Grav Para Term Preterm Abortions TAB SAB Ect Mult Living   1         0      Past Medical History  Diagnosis Date  . Hypertension   . Vaginal Pap smear, abnormal   . Pyelonephritis   . HGLOVFIEP(329.5     Past Surgical History  Procedure Laterality Date  . Meniscus repair      X 2 L knee  . Wisdom tooth extraction      Family History  Problem Relation Age of Onset  . Hypertension Mother   . Diabetes Mother   . Thyroid disease Mother   . Hypertension Father   . Diabetes Maternal Aunt   . Diabetes Paternal Grandmother   . Stroke Paternal Grandmother   . Stroke Paternal Grandfather     History  Substance Use Topics  . Smoking status: Never Smoker   . Smokeless tobacco: Never Used  . Alcohol Use: Yes     Comment: occas. prior to preg.    Allergies:  Allergies  Allergen Reactions  . Amoxicillin Hives  . Penicillins Hives  . Sulfa Antibiotics Hives    Prescriptions prior to admission  Medication Sig Dispense Refill  . acetaminophen (TYLENOL) 500 MG tablet Take 500 mg by mouth every 6 (six) hours as needed.      .Marland KitchenamLODipine (NORVASC) 5 MG tablet Take 5 mg by mouth at bedtime.       . Prenatal Vit-Fe Fumarate-FA (PRENATAL MULTIVITAMIN) TABS tablet Take 1 tablet by mouth daily at 12 noon.        Review of Systems  Constitutional: Negative for fever, chills and malaise/fatigue.   Eyes: Negative for blurred vision and double vision.  Cardiovascular: Positive for leg swelling.  Gastrointestinal: Negative for nausea, vomiting and abdominal pain.  Neurological: Positive for headaches. Negative for focal weakness.   Physical Exam   Blood pressure 144/82, pulse 80, temperature 97.8 F (36.6 C), temperature source Oral, resp. rate 18, SpO2 100.00%. Filed Vitals:   12/20/13 0949 12/20/13 1004 12/20/13 1019 12/20/13 1034  BP: 137/72 127/65 129/70 128/69  Pulse: 82 80 80 81  Temp:      TempSrc:      Resp:      SpO2:        Physical Exam  Constitutional: She is oriented to person, place, and time. She appears well-developed and well-nourished. No distress.  HENT:  Head: Normocephalic.  Cardiovascular: Normal rate.  Exam reveals no gallop and no friction rub.   No murmur heard. Respiratory: Effort normal and breath sounds normal.  GI: Soft. There is no tenderness. There is no rebound and no guarding.  Musculoskeletal: Normal range of motion. She exhibits no edema (pt reports edema, but I see none).  Neurological: She is alert and oriented to person, place, and time. She displays normal reflexes. She exhibits normal muscle tone.  Skin: Skin is warm and dry.  Psychiatric: She has a normal mood and affect.    MAU Course  Procedures  MDM Results for orders placed during the hospital encounter of 12/20/13 (from the past 72 hour(s))  URINALYSIS, ROUTINE W REFLEX MICROSCOPIC     Status: Abnormal   Collection Time    12/20/13  8:25 AM      Result Value Ref Range   Color, Urine YELLOW  YELLOW   APPearance HAZY (*) CLEAR   Specific Gravity, Urine 1.025  1.005 - 1.030   pH 6.5  5.0 - 8.0   Glucose, UA NEGATIVE  NEGATIVE mg/dL   Hgb urine dipstick NEGATIVE  NEGATIVE   Bilirubin Urine NEGATIVE  NEGATIVE   Ketones, ur NEGATIVE  NEGATIVE mg/dL   Protein, ur NEGATIVE  NEGATIVE mg/dL   Urobilinogen, UA 0.2  0.0 - 1.0 mg/dL   Nitrite NEGATIVE  NEGATIVE   Leukocytes,  UA SMALL (*) NEGATIVE  URINE MICROSCOPIC-ADD ON     Status: Abnormal   Collection Time    12/20/13  8:25 AM      Result Value Ref Range   Squamous Epithelial / LPF MANY (*) RARE   WBC, UA 3-6  <3 WBC/hpf   RBC / HPF 0-2  <3 RBC/hpf   Bacteria, UA MANY (*) RARE   Urine-Other MUCOUS PRESENT     Comment: AMORPHOUS URATES/PHOSPHATES  CBC     Status: Abnormal   Collection Time    12/20/13  9:31 AM      Result Value Ref Range   WBC 9.3  4.0 - 10.5 K/uL   RBC 4.16  3.87 - 5.11 MIL/uL   Hemoglobin 12.8  12.0 - 15.0 g/dL   HCT 36.7  36.0 - 46.0 %   MCV 88.2  78.0 - 100.0 fL   MCH 30.8  26.0 - 34.0 pg   MCHC 34.9  30.0 - 36.0 g/dL   RDW 13.8  11.5 - 15.5 %   Platelets 146 (*) 150 - 400 K/uL  COMPREHENSIVE METABOLIC PANEL     Status: Abnormal   Collection Time    12/20/13  9:31 AM      Result Value Ref Range   Sodium 137  137 - 147 mEq/L   Potassium 3.8  3.7 - 5.3 mEq/L   Chloride 102  96 - 112 mEq/L   CO2 20  19 - 32 mEq/L   Glucose, Bld 63 (*) 70 - 99 mg/dL   BUN 6  6 - 23 mg/dL   Creatinine, Ser 0.52  0.50 - 1.10 mg/dL   Calcium 8.7  8.4 - 10.5 mg/dL   Total Protein 6.1  6.0 - 8.3 g/dL   Albumin 2.7 (*) 3.5 - 5.2 g/dL   AST 17  0 - 37 U/L   ALT 14  0 - 35 U/L   Alkaline Phosphatase 95  39 - 117 U/L   Total Bilirubin 0.2 (*) 0.3 - 1.2 mg/dL   GFR calc non Af Amer >90  >90 mL/min   GFR calc Af Amer >90  >90 mL/min   Comment: (NOTE)     The eGFR has been calculated using the CKD EPI equation.     This calculation has not been validated in all clinical situations.     eGFR's persistently <90 mL/min signify possible Chronic Kidney  Disease.  URIC ACID     Status: None   Collection Time    12/20/13  9:31 AM      Result Value Ref Range   Uric Acid, Serum 4.5  2.4 - 7.0 mg/dL     Assessment and Plan  A:  [facility]UP at [redacted]w[redacted]d       Hypertension at home, normotensive here       Normal labs, no evidence of preeclampsia  P:  Discussed with Dr AJulien Girt     Discharge home       Preeclampsia precautions  MSeabron Spates5/27/2015, 9:55 AM

## 2013-12-20 NOTE — Discharge Instructions (Signed)
Hypertension During Pregnancy Hypertension is also called high blood pressure. It can occur at any time in life and during pregnancy. When you have hypertension, there is extra pressure inside your blood vessels that carry blood from the heart to the rest of your body (arteries). Hypertension during pregnancy can cause problems for you and your baby. Your baby might not weigh as much as it should at birth or might be born early (premature). Very bad cases of hypertension during pregnancy can be life threatening.  Different types of hypertension can occur during pregnancy.   Chronic hypertension. This happens when a woman has hypertension before pregnancy and it continues during pregnancy.  Gestational hypertension. This is when hypertension develops during pregnancy.  Preeclampsia or toxemia of pregnancy. This is a very serious type of hypertension that develops only during pregnancy. It is a disease that affects the whole body (systemic) and can be very dangerous for both mother and baby.  Gestational hypertension and preeclampsia usually go away after your baby is born. Blood pressure generally stabilizes within 6 weeks. Women who have hypertension during pregnancy have a greater chance of developing hypertension later in life or with future pregnancies. RISK FACTORS Some factors make you more likely to develop hypertension during pregnancy. Risk factors include:  Having hypertension before pregnancy.  Having hypertension during a previous pregnancy.  Being overweight.  Being older than 40.  Being pregnant with more than one baby (multiples).  Having diabetes or kidney problems. SIGNS AND SYMPTOMS Chronic and gestational hypertension rarely cause symptoms. Preeclampsia has symptoms, which may include:  Increased protein in your urine. Your health care provider will check for this at every prenatal visit.  Swelling of your hands and face.  Rapid weight gain.  Headaches.  Visual  changes.  Being bothered by light.  Abdominal pain, especially in the right upper area.  Chest pain.  Shortness of breath.  Increased reflexes.  Seizures. Seizures occur with a more severe form of preeclampsia, called eclampsia. DIAGNOSIS   You may be diagnosed with hypertension during a regular prenatal exam. At each visit, tests may include:  Blood pressure checks.  A urine test to check for protein in your urine.  The type of hypertension you are diagnosed with depends on when you developed it. It also depends on your specific blood pressure reading.  Developing hypertension before 20 weeks of pregnancy is consistent with chronic hypertension.  Developing hypertension after 20 weeks of pregnancy is consistent with gestational hypertension.  Hypertension with increased urinary protein is diagnosed as preeclampsia.  Blood pressure measurements that stay above 160 systolic or 110 diastolic are a sign of severe preeclampsia. TREATMENT Treatment for hypertension during pregnancy varies. Treatment depends on the type of hypertension and how serious it is.  If you take medicine for chronic hypertension, you may need to switch medicines.  Drugs called ACE inhibitors should not be taken during pregnancy.  Low-dose aspirin may be suggested for women who have risk factors for preeclampsia.  If you have gestational hypertension, you may need to take a blood pressure medicine that is safe during pregnancy. Your health care provider will recommend the appropriate medicine.  If you have severe preeclampsia, you may need to be in the hospital. Health care providers will watch you and the baby very closely. You also may need to take medicine (magnesium sulfate) to prevent seizures and lower blood pressure.  Sometimes an early delivery is needed. This may be the case if the condition worsens. It would   be done to protect you and the baby. The only cure for preeclampsia is delivery. HOME  CARE INSTRUCTIONS  Schedule and keep all of your regular appointments for prenatal care.  Only take over-the-counter or prescription medicines as directed by your health care provider. Tell your health care provider about all medicines you take.  Eat as little salt as possible.  Get regular exercise.  Do not drink alcohol.  Do not use tobacco products.  Do not drink products with caffeine.  Lie on your left side when resting. SEEK IMMEDIATE MEDICAL CARE IF:  You have severe abdominal pain.  You have sudden swelling in the hands, ankles, or face.  You gain 4 pounds (1.8 kg) or more in 1 week.  You vomit repeatedly.  You have vaginal bleeding.  You do not feel the baby moving as much.  You have a headache.  You have blurred or double vision.  You have muscle twitching or spasms.  You have shortness of breath.  You have blue fingernails and lips.  You have blood in your urine. MAKE SURE YOU:  Understand these instructions.  Will watch your condition.  Will get help right away if you are not doing well or get worse. Document Released: 03/31/2011 Document Revised: 05/03/2013 Document Reviewed: 02/09/2013 [employer] Patient Information 2014 [employer].  

## 2013-12-26 LAB — OB RESULTS CONSOLE GBS: GBS: POSITIVE

## 2013-12-27 ENCOUNTER — Encounter (HOSPITAL_COMMUNITY): Payer: Self-pay | Admitting: *Deleted

## 2013-12-27 ENCOUNTER — Telehealth (HOSPITAL_COMMUNITY): Payer: Self-pay | Admitting: *Deleted

## 2013-12-27 NOTE — Telephone Encounter (Signed)
Preadmission screen  

## 2013-12-28 ENCOUNTER — Inpatient Hospital Stay (HOSPITAL_COMMUNITY)
Admission: RE | Admit: 2013-12-28 | Discharge: 2013-12-31 | DRG: 774 | Disposition: A | Discharge status: Home / Self Care | Payer: 59 | Source: Ambulatory Visit | Attending: Obstetrics and Gynecology | Admitting: Obstetrics and Gynecology

## 2013-12-28 ENCOUNTER — Encounter (HOSPITAL_COMMUNITY): Payer: Self-pay

## 2013-12-28 DIAGNOSIS — Z8249 Family history of ischemic heart disease and other diseases of the circulatory system: Secondary | ICD-10-CM

## 2013-12-28 DIAGNOSIS — N189 Chronic kidney disease, unspecified: Secondary | ICD-10-CM | POA: Diagnosis present

## 2013-12-28 DIAGNOSIS — Z22330 Carrier of Group B streptococcus: Secondary | ICD-10-CM

## 2013-12-28 DIAGNOSIS — O9989 Other specified diseases and conditions complicating pregnancy, childbirth and the puerperium: Secondary | ICD-10-CM

## 2013-12-28 DIAGNOSIS — O139 Gestational [pregnancy-induced] hypertension without significant proteinuria, unspecified trimester: Secondary | ICD-10-CM | POA: Diagnosis present

## 2013-12-28 DIAGNOSIS — Z6832 Body mass index (BMI) 32.0-32.9, adult: Secondary | ICD-10-CM

## 2013-12-28 DIAGNOSIS — O99892 Other specified diseases and conditions complicating childbirth: Secondary | ICD-10-CM | POA: Diagnosis present

## 2013-12-28 DIAGNOSIS — E669 Obesity, unspecified: Secondary | ICD-10-CM | POA: Diagnosis present

## 2013-12-28 DIAGNOSIS — O1092 Unspecified pre-existing hypertension complicating childbirth: Principal | ICD-10-CM | POA: Diagnosis present

## 2013-12-28 DIAGNOSIS — Z823 Family history of stroke: Secondary | ICD-10-CM

## 2013-12-28 DIAGNOSIS — O99214 Obesity complicating childbirth: Secondary | ICD-10-CM

## 2013-12-28 DIAGNOSIS — Z833 Family history of diabetes mellitus: Secondary | ICD-10-CM

## 2013-12-28 DIAGNOSIS — I129 Hypertensive chronic kidney disease with stage 1 through stage 4 chronic kidney disease, or unspecified chronic kidney disease: Secondary | ICD-10-CM | POA: Diagnosis present

## 2013-12-28 DIAGNOSIS — O10919 Unspecified pre-existing hypertension complicating pregnancy, unspecified trimester: Principal | ICD-10-CM | POA: Diagnosis present

## 2013-12-28 LAB — COMPREHENSIVE METABOLIC PANEL
ALT: 14 U/L (ref 0–35)
AST: 17 U/L (ref 0–37)
Albumin: 2.7 g/dL — ABNORMAL LOW (ref 3.5–5.2)
Alkaline Phosphatase: 101 U/L (ref 39–117)
BILIRUBIN TOTAL: 0.3 mg/dL (ref 0.3–1.2)
BUN: 10 mg/dL (ref 6–23)
CHLORIDE: 103 meq/L (ref 96–112)
CO2: 21 mEq/L (ref 19–32)
CREATININE: 0.51 mg/dL (ref 0.50–1.10)
Calcium: 8.9 mg/dL (ref 8.4–10.5)
GFR calc Af Amer: >90 mL/min (ref >90)
Glucose, Bld: 85 mg/dL (ref 70–99)
Potassium: 3.8 mEq/L (ref 3.7–5.3)
Sodium: 136 mEq/L — ABNORMAL LOW (ref 137–147)
Total Protein: 6.1 g/dL (ref 6.0–8.3)

## 2013-12-28 LAB — CBC
HCT: 36.4 % (ref 36.0–46.0)
HEMOGLOBIN: 12.6 g/dL (ref 12.0–15.0)
MCH: 30.6 pg (ref 26.0–34.0)
MCHC: 34.6 g/dL (ref 30.0–36.0)
MCV: 88.3 fL (ref 78.0–100.0)
Platelets: 178 10*3/uL (ref 150–400)
RBC: 4.12 MIL/uL (ref 3.87–5.11)
RDW: 14.2 % (ref 11.5–15.5)
WBC: 11.1 10*3/uL — ABNORMAL HIGH (ref 4.0–10.5)

## 2013-12-28 LAB — URIC ACID: Uric Acid, Serum: 4.7 mg/dL (ref 2.4–7.0)

## 2013-12-28 MED ORDER — TERBUTALINE SULFATE 1 MG/ML IJ SOLN: 0.2500 mg | Freq: Once | INTRAMUSCULAR | Status: AC | PRN

## 2013-12-28 MED ORDER — ACETAMINOPHEN 325 MG PO TABS: 650.0000 mg | ORAL_TABLET | ORAL | Status: DC | PRN

## 2013-12-28 MED ORDER — OXYCODONE-ACETAMINOPHEN 5-325 MG PO TABS: 1.0000 | ORAL_TABLET | ORAL | Status: DC | PRN

## 2013-12-28 MED ORDER — ONDANSETRON HCL 4 MG/2ML IJ SOLN
4.0000 mg | Freq: Four times a day (QID) | INTRAMUSCULAR | Status: DC | PRN
  Administered 2013-12-29: 4 mg via INTRAVENOUS
  Filled 2013-12-28: qty 2

## 2013-12-28 MED ORDER — OXYTOCIN BOLUS FROM INFUSION
500.0000 mL | INTRAVENOUS | Status: DC
  Administered 2013-12-29: 500 mL via INTRAVENOUS

## 2013-12-28 MED ORDER — CITRIC ACID-SODIUM CITRATE 334-500 MG/5ML PO SOLN: 30.0000 mL | ORAL | Status: DC | PRN

## 2013-12-28 MED ORDER — LACTATED RINGERS IV SOLN
INTRAVENOUS | Status: DC
  Administered 2013-12-28: 21:00:00 via INTRAVENOUS
  Administered 2013-12-29: 125 mL/h via INTRAVENOUS

## 2013-12-28 MED ORDER — MISOPROSTOL 25 MCG QUARTER TABLET
25.0000 ug | ORAL_TABLET | ORAL | Status: DC | PRN
Start: 2013-12-28 — End: 2013-12-29
  Administered 2013-12-28 – 2013-12-29 (×2): 25 ug via VAGINAL
  Filled 2013-12-28 (×2): qty 0.25
  Filled 2013-12-28: qty 1

## 2013-12-28 MED ORDER — LACTATED RINGERS IV SOLN: 500.0000 mL | INTRAVENOUS | Status: DC | PRN

## 2013-12-28 MED ORDER — FLEET ENEMA 7-19 GM/118ML RE ENEM: 1.0000 | ENEMA | RECTAL | Status: DC | PRN

## 2013-12-28 MED ORDER — IBUPROFEN 600 MG PO TABS: 600.0000 mg | ORAL_TABLET | Freq: Four times a day (QID) | ORAL | Status: DC | PRN

## 2013-12-28 MED ORDER — AMLODIPINE BESYLATE 5 MG PO TABS
5.0000 mg | ORAL_TABLET | Freq: Every day | ORAL | Status: DC
  Administered 2013-12-28: 5 mg via ORAL
  Filled 2013-12-28 (×2): qty 1

## 2013-12-28 MED ORDER — LIDOCAINE HCL (PF) 1 % IJ SOLN
30.0000 mL | INTRAMUSCULAR | Status: DC | PRN
  Administered 2013-12-29: 30 mL via SUBCUTANEOUS
  Filled 2013-12-28: qty 30

## 2013-12-28 MED ORDER — ZOLPIDEM TARTRATE 5 MG PO TABS
5.0000 mg | ORAL_TABLET | Freq: Every evening | ORAL | Status: DC | PRN
  Administered 2013-12-29: 5 mg via ORAL
  Filled 2013-12-28: qty 1

## 2013-12-28 MED ORDER — OXYTOCIN 40 UNITS IN LACTATED RINGERS INFUSION - SIMPLE MED: 62.5000 mL/h | INTRAVENOUS | Status: DC

## 2013-12-28 MED ORDER — CLINDAMYCIN PHOSPHATE 900 MG/50ML IV SOLN
900.0000 mg | Freq: Three times a day (TID) | INTRAVENOUS | Status: DC
  Administered 2013-12-28 – 2013-12-29 (×3): 900 mg via INTRAVENOUS
  Filled 2013-12-28 (×4): qty 50

## 2013-12-28 NOTE — H&P (Signed)
Leslie Howard is a 28 y.o. female presenting for IOL. Patient has CHTN on amlodipine. BPs becoming more labile and diastolics upper 90s to low 100s. Maternal Medical History:  Fetal activity: Perceived fetal activity is normal.      OB History   Grav Para Term Preterm Abortions TAB SAB Ect Mult Living   1         0     Past Medical History  Diagnosis Date  . Hypertension   . Vaginal Pap smear, abnormal   . Pyelonephritis   . HWEXHBZJ(696.7)    Past Surgical History  Procedure Laterality Date  . Meniscus repair      X 2 L knee  . Wisdom tooth extraction     Family History: family history includes Diabetes in her maternal aunt, mother, and paternal grandmother; Hypertension in her father and mother; Stroke in her paternal grandfather and paternal grandmother; Thyroid disease in her mother. Social History:  reports that she has never smoked. She has never used smokeless tobacco. She reports that she drinks alcohol. She reports that she does not use illicit drugs.   Prenatal Transfer Tool  Maternal Diabetes: No Genetic Screening: Normal Maternal Ultrasounds/Referrals: Normal Fetal Ultrasounds or other Referrals:  None Maternal Substance Abuse:  No Significant Maternal Medications:  None Significant Maternal Lab Results:  None Other Comments:  None  Review of Systems  HENT:       Mild HA for 1-2 weeks    Dilation: Closed Effacement (%): 30 Station: -3 Exam by:: Dr Henderson Cloud Blood pressure 143/83, pulse 75, temperature 98.5 F (36.9 C), temperature source Oral, resp. rate 20, height 5\' 9"  (1.753 m), weight 97.977 kg (216 lb). Maternal Exam:  Uterine Assessment: Contraction strength is mild.  Contraction frequency is irregular.   Abdomen: Fetal presentation: vertex     Fetal Exam Fetal State Assessment: Category I - tracings are normal.     Physical Exam  Cardiovascular: Normal rate and regular rhythm.   Respiratory: Effort normal and breath sounds normal.   GI: Soft. There is no tenderness.  Neurological: She has normal reflexes.    Prenatal labs: ABO, Rh: A/Positive/-- (10/28 0000) Antibody: Negative (10/28 0000) Rubella: Immune (10/28 0000) RPR: Nonreactive (10/28 0000)  HBsAg: Negative (10/28 0000)  HIV: Non-reactive (10/28 0000)  GBS: Positive (06/02 0000)   Assessment/Plan: 28 yo G1P0 at 37 4/7 weeks with CHTN and increasingly labile BPs and HA. Will check PIH labs. D/W patient and husband If sxs worsen or labs abnormal will begin magnesium sulfate for sz prophylaxis D/W two stage induction and risks   Roselle Locus II 12/28/2013, 9:33 PM

## 2013-12-29 ENCOUNTER — Inpatient Hospital Stay (HOSPITAL_COMMUNITY): Payer: 59 | Admitting: Anesthesiology

## 2013-12-29 ENCOUNTER — Encounter (HOSPITAL_COMMUNITY): Payer: 59 | Admitting: Anesthesiology

## 2013-12-29 ENCOUNTER — Encounter (HOSPITAL_COMMUNITY): Payer: Self-pay

## 2013-12-29 LAB — CBC
HEMATOCRIT: 36.5 % (ref 36.0–46.0)
Hemoglobin: 12.8 g/dL (ref 12.0–15.0)
MCH: 31 pg (ref 26.0–34.0)
MCHC: 35.1 g/dL (ref 30.0–36.0)
MCV: 88.4 fL (ref 78.0–100.0)
PLATELETS: 144 10*3/uL — AB (ref 150–400)
RBC: 4.13 MIL/uL (ref 3.87–5.11)
RDW: 13.9 % (ref 11.5–15.5)
WBC: 9.9 10*3/uL (ref 4.0–10.5)

## 2013-12-29 LAB — RPR

## 2013-12-29 LAB — ABO/RH: ABO/RH(D): A POS

## 2013-12-29 MED ORDER — AMLODIPINE BESYLATE 5 MG PO TABS: 5.0000 mg | ORAL_TABLET | Freq: Every day | ORAL | Status: DC

## 2013-12-29 MED ORDER — IBUPROFEN 600 MG PO TABS
600.0000 mg | ORAL_TABLET | Freq: Four times a day (QID) | ORAL | Status: DC
  Administered 2013-12-29 – 2013-12-30 (×5): 600 mg via ORAL
  Filled 2013-12-29 (×6): qty 1

## 2013-12-29 MED ORDER — FENTANYL 2.5 MCG/ML BUPIVACAINE 1/10 % EPIDURAL INFUSION (WH - ANES)
INTRAMUSCULAR | Status: AC
  Filled 2013-12-29: qty 125

## 2013-12-29 MED ORDER — OXYCODONE-ACETAMINOPHEN 5-325 MG PO TABS: 1.0000 | ORAL_TABLET | ORAL | Status: DC | PRN

## 2013-12-29 MED ORDER — FENTANYL 2.5 MCG/ML BUPIVACAINE 1/10 % EPIDURAL INFUSION (WH - ANES)
14.0000 mL/h | INTRAMUSCULAR | Status: DC | PRN
  Administered 2013-12-29: 14 mL/h via EPIDURAL
  Filled 2013-12-29: qty 125

## 2013-12-29 MED ORDER — TETANUS-DIPHTH-ACELL PERTUSSIS 5-2.5-18.5 LF-MCG/0.5 IM SUSP: 0.5000 mL | Freq: Once | INTRAMUSCULAR | Status: DC

## 2013-12-29 MED ORDER — OXYTOCIN 40 UNITS IN LACTATED RINGERS INFUSION - SIMPLE MED
1.0000 m[IU]/min | INTRAVENOUS | Status: DC
  Administered 2013-12-29: 2 m[IU]/min via INTRAVENOUS
  Filled 2013-12-29: qty 1000

## 2013-12-29 MED ORDER — SENNOSIDES-DOCUSATE SODIUM 8.6-50 MG PO TABS
2.0000 | ORAL_TABLET | ORAL | Status: DC
  Administered 2013-12-29 – 2013-12-30 (×2): 2 via ORAL
  Filled 2013-12-29 (×2): qty 2

## 2013-12-29 MED ORDER — LACTATED RINGERS IV SOLN
500.0000 mL | Freq: Once | INTRAVENOUS | Status: AC
  Administered 2013-12-29: 500 mL via INTRAVENOUS

## 2013-12-29 MED ORDER — EPHEDRINE 5 MG/ML INJ
10.0000 mg | INTRAVENOUS | Status: DC | PRN
  Filled 2013-12-29: qty 2

## 2013-12-29 MED ORDER — TERBUTALINE SULFATE 1 MG/ML IJ SOLN: 0.2500 mg | Freq: Once | INTRAMUSCULAR | Status: DC | PRN

## 2013-12-29 MED ORDER — PHENYLEPHRINE 40 MCG/ML (10ML) SYRINGE FOR IV PUSH (FOR BLOOD PRESSURE SUPPORT)
PREFILLED_SYRINGE | INTRAVENOUS | Status: AC
  Filled 2013-12-29: qty 10

## 2013-12-29 MED ORDER — FENTANYL 2.5 MCG/ML BUPIVACAINE 1/10 % EPIDURAL INFUSION (WH - ANES)
INTRAMUSCULAR | Status: DC | PRN
Start: 2013-12-29 — End: 2013-12-30
  Administered 2013-12-29: 15 mL/h via EPIDURAL

## 2013-12-29 MED ORDER — PHENYLEPHRINE 40 MCG/ML (10ML) SYRINGE FOR IV PUSH (FOR BLOOD PRESSURE SUPPORT)
80.0000 ug | PREFILLED_SYRINGE | INTRAVENOUS | Status: DC | PRN
  Filled 2013-12-29: qty 2

## 2013-12-29 MED ORDER — ZOLPIDEM TARTRATE 5 MG PO TABS: 5.0000 mg | ORAL_TABLET | Freq: Every evening | ORAL | Status: DC | PRN

## 2013-12-29 MED ORDER — PRENATAL MULTIVITAMIN CH
1.0000 | ORAL_TABLET | Freq: Every day | ORAL | Status: DC
  Administered 2013-12-30: 1 via ORAL
  Filled 2013-12-29: qty 1

## 2013-12-29 MED ORDER — BENZOCAINE-MENTHOL 20-0.5 % EX AERO
1.0000 "application " | INHALATION_SPRAY | CUTANEOUS | Status: DC | PRN
  Administered 2013-12-29: 1 via TOPICAL
  Filled 2013-12-29: qty 56

## 2013-12-29 MED ORDER — AMLODIPINE BESYLATE 5 MG PO TABS
5.0000 mg | ORAL_TABLET | Freq: Every day | ORAL | Status: DC
  Administered 2013-12-29 – 2013-12-30 (×2): 5 mg via ORAL
  Filled 2013-12-29 (×2): qty 1

## 2013-12-29 MED ORDER — PHENYLEPHRINE 40 MCG/ML (10ML) SYRINGE FOR IV PUSH (FOR BLOOD PRESSURE SUPPORT)
80.0000 ug | PREFILLED_SYRINGE | INTRAVENOUS | Status: DC | PRN
Start: 2013-12-29 — End: 2013-12-29
  Filled 2013-12-29: qty 2

## 2013-12-29 MED ORDER — ONDANSETRON HCL 4 MG/2ML IJ SOLN: 4.0000 mg | INTRAMUSCULAR | Status: DC | PRN

## 2013-12-29 MED ORDER — SIMETHICONE 80 MG PO CHEW: 80.0000 mg | CHEWABLE_TABLET | ORAL | Status: DC | PRN

## 2013-12-29 MED ORDER — MEASLES, MUMPS & RUBELLA VAC ~~LOC~~ INJ: 0.5000 mL | INJECTION | Freq: Once | SUBCUTANEOUS | Status: DC

## 2013-12-29 MED ORDER — LANOLIN HYDROUS EX OINT: TOPICAL_OINTMENT | CUTANEOUS | Status: DC | PRN

## 2013-12-29 MED ORDER — ONDANSETRON HCL 4 MG PO TABS: 4.0000 mg | ORAL_TABLET | ORAL | Status: DC | PRN

## 2013-12-29 MED ORDER — EPHEDRINE 5 MG/ML INJ
INTRAVENOUS | Status: AC
  Filled 2013-12-29: qty 4

## 2013-12-29 MED ORDER — WITCH HAZEL-GLYCERIN EX PADS: 1.0000 "application " | MEDICATED_PAD | CUTANEOUS | Status: DC | PRN

## 2013-12-29 MED ORDER — DIBUCAINE 1 % RE OINT: 1.0000 "application " | TOPICAL_OINTMENT | RECTAL | Status: DC | PRN

## 2013-12-29 MED ORDER — LIDOCAINE HCL (PF) 1 % IJ SOLN
INTRAMUSCULAR | Status: DC | PRN
Start: 2013-12-29 — End: 2013-12-30
  Administered 2013-12-29 (×2): 4 mL

## 2013-12-29 MED ORDER — DIPHENHYDRAMINE HCL 25 MG PO CAPS: 25.0000 mg | ORAL_CAPSULE | Freq: Four times a day (QID) | ORAL | Status: DC | PRN

## 2013-12-29 MED ORDER — DIPHENHYDRAMINE HCL 50 MG/ML IJ SOLN: 12.5000 mg | INTRAMUSCULAR | Status: DC | PRN

## 2013-12-29 MED ORDER — MEDROXYPROGESTERONE ACETATE 150 MG/ML IM SUSP: 150.0000 mg | INTRAMUSCULAR | Status: DC | PRN

## 2013-12-29 NOTE — Anesthesia Procedure Notes (Signed)
Epidural Patient location during procedure: OB Start time: 12/29/2013 10:15 AM  Staffing Anesthesiologist: Macedonio Scallon A.  Preanesthetic Checklist Completed: patient identified, site marked, surgical consent, pre-op evaluation, timeout performed, IV checked, risks and benefits discussed and monitors and equipment checked  Epidural Patient position: sitting Prep: site prepped and draped and DuraPrep Patient monitoring: continuous pulse ox and blood pressure Approach: midline Location: L4-L5 Injection technique: LOR air  Needle:  Needle type: Tuohy  Needle gauge: 17 G Needle length: 9 cm and 9 Needle insertion depth: 6 cm Catheter type: closed end flexible Catheter size: 19 Gauge Catheter at skin depth: 9 cm Test dose: negative and Other  Assessment Events: blood not aspirated, injection not painful, no injection resistance, negative IV test and no paresthesia  Additional Notes Patient identified. Risks and benefits discussed including failed block, incomplete  Pain control, post dural puncture headache, nerve damage, paralysis, blood pressure Changes, nausea, vomiting, reactions to medications-both toxic and allergic and post Partum back pain. All questions were answered. Patient expressed understanding and wished to proceed. Sterile technique was used throughout procedure. Epidural site was Dressed with sterile barrier dressing. No paresthesias, signs of intravascular injection Or signs of intrathecal spread were encountered.  Patient was more comfortable after the epidural was dosed. Please see RN's note for documentation of vital signs and FHR which are stable.

## 2013-12-29 NOTE — Anesthesia Preprocedure Evaluation (Signed)
Anesthesia Evaluation  Patient identified by MRN, date of birth, ID band Patient awake    Reviewed: Allergy & Precautions, H&P , Patient's Chart, lab work & pertinent test results  Airway Mallampati: III TM Distance: >3 FB Neck ROM: Full    Dental no notable dental hx. (+) Teeth Intact   Pulmonary neg pulmonary ROS,  breath sounds clear to auscultation  Pulmonary exam normal       Cardiovascular hypertension, Rhythm:Regular Rate:Normal     Neuro/Psych negative neurological ROS  negative psych ROS   GI/Hepatic Neg liver ROS,   Endo/Other  Obesity  Renal/GU Renal diseasePyelonephritis  negative genitourinary   Musculoskeletal negative musculoskeletal ROS (+)   Abdominal (+) + obese,   Peds  Hematology negative hematology ROS (+)   Anesthesia Other Findings   Reproductive/Obstetrics (+) Pregnancy                           Anesthesia Physical Anesthesia Plan  ASA: II  Anesthesia Plan: Epidural   Post-op Pain Management:    Induction:   Airway Management Planned: Natural Airway  Additional Equipment:   Intra-op Plan:   Post-operative Plan:   Informed Consent: I have reviewed the patients History and Physical, chart, labs and discussed the procedure including the risks, benefits and alternatives for the proposed anesthesia with the patient or authorized representative who has indicated his/her understanding and acceptance.     Plan Discussed with: Anesthesiologist  Anesthesia Plan Comments:         Anesthesia Quick Evaluation

## 2013-12-29 NOTE — Progress Notes (Signed)
Monitors removed for showeer

## 2013-12-29 NOTE — Progress Notes (Signed)
Patient ID: Leslie Howard, female   DOB: 1986/04/05, 28 y.o.   MRN: [id number] Pt without complaints GFM  No PIH sxs  VSSAF FHR cat 1  Ctx mild q 3  Cx  2/75/-3 AROM clear DTRs 1/4  Continue IOL Stable Anticipate SVD Abx for GBS

## 2013-12-30 LAB — CBC
HCT: 34 % — ABNORMAL LOW (ref 36.0–46.0)
Hemoglobin: 11.6 g/dL — ABNORMAL LOW (ref 12.0–15.0)
MCH: 30.1 pg (ref 26.0–34.0)
MCHC: 34.1 g/dL (ref 30.0–36.0)
MCV: 88.3 fL (ref 78.0–100.0)
Platelets: 151 10*3/uL (ref 150–400)
RBC: 3.85 MIL/uL — AB (ref 3.87–5.11)
RDW: 14.1 % (ref 11.5–15.5)
WBC: 13.6 10*3/uL — ABNORMAL HIGH (ref 4.0–10.5)

## 2013-12-30 NOTE — Progress Notes (Signed)
Post Partum Day 1 Subjective: no complaints, up ad lib, voiding and tolerating PO  Objective: Blood pressure 118/78, pulse 80, temperature 98.4 F (36.9 C), temperature source Oral, resp. rate 20, height 5\' 9"  (1.753 m), weight 97.977 kg (216 lb), SpO2 100.00%, unknown if currently breastfeeding.  Physical Exam:  General: alert, cooperative, appears stated age and no distress Lochia: appropriate Uterine Fundus: firm Incision: healing well DVT Evaluation: No evidence of DVT seen on physical exam.   Recent Labs  12/29/13 0905 12/30/13 0600  HGB 12.8 11.6*  HCT 36.5 34.0*    Assessment/Plan: Plan for discharge tomorrow   LOS: 2 days   Turner Daniels 12/30/2013, 9:43 AM

## 2013-12-30 NOTE — Lactation Note (Signed)
This note was copied from the chart of Girl Kennidee Simonton. Lactation Consultation Note: Mom has been giving formula. Visit with mom and she states she has decided to just give formula. Offered pumping and bottle feeding EBM but mom refused. Several visitors present.   Patient Name: Girl Alyasia Dubose [id number] Date: 12/30/2013 Reason for consult: Follow-up assessment   Maternal Data    Feeding Feeding Type: Bottle Fed - Formula  LATCH Score/Interventions                      Lactation Tools Discussed/Used     Consult Status Consult Status: Complete    Pamelia Hoit 12/30/2013, 2:48 PM

## 2013-12-30 NOTE — Anesthesia Postprocedure Evaluation (Signed)
Anesthesia Post Note  Patient: Leslie Howard  Procedure(s) Performed: * No procedures listed *  Anesthesia type: Epidural  Patient location: Mother/Baby  Post pain: Pain level controlled  Post assessment: Post-op Vital signs reviewed  Last Vitals:  Filed Vitals:   12/30/13 0515  BP: 118/78  Pulse: 80  Temp: 36.9 C  Resp: 20    Post vital signs: Reviewed  Level of consciousness:alert  Complications: No apparent anesthesia complications

## 2013-12-31 MED ORDER — IBUPROFEN 600 MG PO TABS: 600.0000 mg | ORAL_TABLET | Freq: Four times a day (QID) | ORAL | Status: AC

## 2013-12-31 NOTE — Discharge Summary (Signed)
Obstetric Discharge Summary Reason for Admission: induction of labor Prenatal Procedures: none Intrapartum Procedures: spontaneous vaginal delivery Postpartum Procedures: none Complications-Operative and Postpartum: vaginal lac Hemoglobin  Date Value Ref Range Status  12/30/2013 11.6* 12.0 - 15.0 g/dL Final  01/00/7121 97.5   Final     HCT  Date Value Ref Range Status  12/30/2013 34.0* 36.0 - 46.0 % Final  05/23/2013 37   Final    Physical Exam:  General: alert, not distress Lochia: appropriate Uterine Fundus: firm Incision: healing well DVT Evaluation: No evidence of DVT seen on physical exam.  Discharge Diagnoses: Term Pregnancy-delivered  Discharge Information: Date: 12/31/2013 Activity: pelvic rest Diet: routine Medications: Ibuprofen, norvasc Condition: stable Instructions: refer to practice specific booklet Discharge to: home   Newborn Data: Live born female  Birth Weight: 6 lb 11.4 oz (3045 g) APGAR: 9, 9  Home with mother.  Turner Daniels 12/31/2013, 9:01 AM

## 2014-05-28 ENCOUNTER — Encounter (HOSPITAL_COMMUNITY): Payer: Self-pay

## 2014-07-05 ENCOUNTER — Encounter (HOSPITAL_COMMUNITY): Payer: Self-pay

## 2014-07-05 ENCOUNTER — Emergency Department (INDEPENDENT_AMBULATORY_CARE_PROVIDER_SITE_OTHER)
Admission: EM | Admit: 2014-07-05 | Discharge: 2014-07-05 | Disposition: A | Discharge status: Home / Self Care | Payer: Self-pay | Source: Home / Self Care

## 2014-07-05 DIAGNOSIS — S161XXA Strain of muscle, fascia and tendon at neck level, initial encounter: Secondary | ICD-10-CM

## 2014-07-05 MED ORDER — CYCLOBENZAPRINE HCL 5 MG PO TABS: 5.0000 mg | ORAL_TABLET | Freq: Three times a day (TID) | ORAL | Status: AC | PRN
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
## 2014-12-28 ENCOUNTER — Other Ambulatory Visit: Payer: Self-pay | Admitting: Occupational Medicine

## 2014-12-28 ENCOUNTER — Ambulatory Visit
Admission: RE | Admit: 2014-12-28 | Discharge: 2014-12-28 | Disposition: A | Discharge status: Home / Self Care | Payer: No Typology Code available for payment source | Source: Ambulatory Visit | Attending: Occupational Medicine | Admitting: Occupational Medicine

## 2014-12-28 DIAGNOSIS — Z021 Encounter for pre-employment examination: Secondary | ICD-10-CM

## 2015-05-31 ENCOUNTER — Ambulatory Visit
Admission: RE | Admit: 2015-05-31 | Discharge: 2015-05-31 | Disposition: A | Discharge status: Home / Self Care | Payer: 59 | Source: Ambulatory Visit | Attending: Family Medicine | Admitting: Family Medicine

## 2015-05-31 ENCOUNTER — Other Ambulatory Visit: Payer: Self-pay | Admitting: Family Medicine

## 2015-05-31 DIAGNOSIS — R9389 Abnormal findings on diagnostic imaging of other specified body structures: Secondary | ICD-10-CM

## 2016-08-04 DIAGNOSIS — Z0100 Encounter for examination of eyes and vision without abnormal findings: Secondary | ICD-10-CM | POA: Diagnosis not present

## 2016-08-06 DIAGNOSIS — L918 Other hypertrophic disorders of the skin: Secondary | ICD-10-CM | POA: Diagnosis not present

## 2016-09-25 DIAGNOSIS — L918 Other hypertrophic disorders of the skin: Secondary | ICD-10-CM | POA: Diagnosis not present

## 2016-09-29 DIAGNOSIS — M940 Chondrocostal junction syndrome [Tietze]: Secondary | ICD-10-CM | POA: Diagnosis not present

## 2016-09-29 DIAGNOSIS — I10 Essential (primary) hypertension: Secondary | ICD-10-CM | POA: Diagnosis not present

## 2016-09-29 DIAGNOSIS — H6993 Unspecified Eustachian tube disorder, bilateral: Secondary | ICD-10-CM | POA: Diagnosis not present

## 2016-10-14 DIAGNOSIS — L918 Other hypertrophic disorders of the skin: Secondary | ICD-10-CM | POA: Diagnosis not present

## 2016-10-24 DIAGNOSIS — S70362A Insect bite (nonvenomous), left thigh, initial encounter: Secondary | ICD-10-CM | POA: Diagnosis not present

## 2016-10-24 DIAGNOSIS — Z79899 Other long term (current) drug therapy: Secondary | ICD-10-CM | POA: Diagnosis not present

## 2016-10-24 DIAGNOSIS — L03119 Cellulitis of unspecified part of limb: Secondary | ICD-10-CM | POA: Diagnosis not present

## 2016-10-24 DIAGNOSIS — W57XXXA Bitten or stung by nonvenomous insect and other nonvenomous arthropods, initial encounter: Secondary | ICD-10-CM | POA: Diagnosis not present

## 2016-10-24 DIAGNOSIS — Z7689 Persons encountering health services in other specified circumstances: Secondary | ICD-10-CM | POA: Diagnosis not present

## 2017-03-02 DIAGNOSIS — R05 Cough: Secondary | ICD-10-CM | POA: Diagnosis not present

## 2017-03-02 DIAGNOSIS — R5383 Other fatigue: Secondary | ICD-10-CM | POA: Diagnosis not present

## 2017-03-02 DIAGNOSIS — J029 Acute pharyngitis, unspecified: Secondary | ICD-10-CM | POA: Diagnosis not present

## 2017-03-19 DIAGNOSIS — J029 Acute pharyngitis, unspecified: Secondary | ICD-10-CM | POA: Diagnosis not present

## 2017-05-12 DIAGNOSIS — Z01419 Encounter for gynecological examination (general) (routine) without abnormal findings: Secondary | ICD-10-CM | POA: Diagnosis not present

## 2017-08-16 DIAGNOSIS — Z0100 Encounter for examination of eyes and vision without abnormal findings: Secondary | ICD-10-CM | POA: Diagnosis not present

## 2017-10-14 DIAGNOSIS — E7800 Pure hypercholesterolemia, unspecified: Secondary | ICD-10-CM | POA: Diagnosis not present

## 2017-10-14 DIAGNOSIS — I10 Essential (primary) hypertension: Secondary | ICD-10-CM | POA: Diagnosis not present

## 2017-11-30 DIAGNOSIS — J020 Streptococcal pharyngitis: Secondary | ICD-10-CM | POA: Diagnosis not present

## 2018-02-15 DIAGNOSIS — M79672 Pain in left foot: Secondary | ICD-10-CM | POA: Diagnosis not present

## 2018-04-28 DIAGNOSIS — Z23 Encounter for immunization: Secondary | ICD-10-CM | POA: Diagnosis not present

## 2018-04-28 DIAGNOSIS — L700 Acne vulgaris: Secondary | ICD-10-CM | POA: Diagnosis not present

## 2018-05-02 DIAGNOSIS — S39012A Strain of muscle, fascia and tendon of lower back, initial encounter: Secondary | ICD-10-CM | POA: Diagnosis not present

## 2018-05-02 DIAGNOSIS — M5416 Radiculopathy, lumbar region: Secondary | ICD-10-CM | POA: Diagnosis not present

## 2018-05-16 DIAGNOSIS — Z01419 Encounter for gynecological examination (general) (routine) without abnormal findings: Secondary | ICD-10-CM | POA: Diagnosis not present

## 2018-05-16 DIAGNOSIS — Z6832 Body mass index (BMI) 32.0-32.9, adult: Secondary | ICD-10-CM | POA: Diagnosis not present

## 2018-06-30 ENCOUNTER — Other Ambulatory Visit: Payer: Self-pay | Admitting: Family Medicine

## 2018-06-30 ENCOUNTER — Ambulatory Visit: Payer: Self-pay

## 2018-06-30 DIAGNOSIS — M25511 Pain in right shoulder: Secondary | ICD-10-CM

## 2018-07-28 DIAGNOSIS — L700 Acne vulgaris: Secondary | ICD-10-CM | POA: Diagnosis not present

## 2018-07-28 DIAGNOSIS — Z79899 Other long term (current) drug therapy: Secondary | ICD-10-CM | POA: Diagnosis not present

## 2018-08-18 DIAGNOSIS — Z0100 Encounter for examination of eyes and vision without abnormal findings: Secondary | ICD-10-CM | POA: Diagnosis not present

## 2018-08-25 DIAGNOSIS — L700 Acne vulgaris: Secondary | ICD-10-CM | POA: Diagnosis not present

## 2018-08-25 DIAGNOSIS — Z79899 Other long term (current) drug therapy: Secondary | ICD-10-CM | POA: Diagnosis not present
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
## 2019-09-15 ENCOUNTER — Ambulatory Visit: Payer: 59 | Attending: Internal Medicine

## 2019-09-15 DIAGNOSIS — Z23 Encounter for immunization: Secondary | ICD-10-CM | POA: Insufficient documentation

## 2019-09-15 NOTE — Progress Notes (Signed)
   Covid-19 Vaccination Clinic  Name:  CORLENE SABIA    MRN: [id number] DOB: 05/01/1986  09/15/2019  Ms. Schachter was observed post Covid-19 immunization for 15 minutes without incidence. She was provided with Vaccine Information Sheet and instruction to access the V-Safe system.   Ms. Boehm was instructed to call 911 with any severe reactions post vaccine: Marland Kitchen Difficulty breathing  . Swelling of your face and throat  . A fast heartbeat  . A bad rash all over your body  . Dizziness and weakness    Immunizations Administered    Name Date Dose VIS Date Route   Pfizer COVID-19 Vaccine 09/15/2019 10:01 AM 0.3 mL 07/07/2019 Intramuscular   Manufacturer: [employer], [employer]   Lot: [id number]   NDC: [address]-2

## 2019-10-10 ENCOUNTER — Ambulatory Visit: Payer: 59 | Attending: Internal Medicine

## 2019-10-10 DIAGNOSIS — Z23 Encounter for immunization: Secondary | ICD-10-CM

## 2019-10-10 NOTE — Progress Notes (Signed)
   Covid-19 Vaccination Clinic  Name:  Leslie Howard    MRN: [id number] DOB: November 07, 1985  10/10/2019  [name] was observed post Covid-19 immunization for 15 minutes without incident. She was provided with Vaccine Information Sheet and instruction to access the V-Safe system.   [name] was instructed to call 911 with any severe reactions post vaccine: Marland Kitchen Difficulty breathing  . Swelling of face and throat  . A fast heartbeat  . A bad rash all over body  . Dizziness and weakness   Immunizations Administered    Name Date Dose VIS Date Route   Pfizer COVID-19 Vaccine 10/10/2019  8:25 AM 0.3 mL 07/07/2019 Intramuscular   Manufacturer: [employer], [employer]   Lot: [id number]   NDC: [address]-0
